# Patient Record
Sex: Male | Born: 1995 | Race: White | Hispanic: No | Marital: Single | Smoking: Never smoker
Health system: Southern US, Community
[De-identification: ages and names within clinical notes are randomized; demographics above are authoritative.]

## PROBLEM LIST (undated history)

## (undated) HISTORY — PX: FOOT SURGERY: SHX648

## (undated) HISTORY — PX: KNEE SURGERY: SHX244

---
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
## 2014-01-01 ENCOUNTER — Ambulatory Visit: Payer: Medicaid Other | Admitting: Podiatry

## 2014-01-19 ENCOUNTER — Encounter: Payer: Self-pay | Admitting: Podiatry

## 2014-01-19 ENCOUNTER — Ambulatory Visit (INDEPENDENT_AMBULATORY_CARE_PROVIDER_SITE_OTHER): Payer: Medicaid Other

## 2014-01-19 ENCOUNTER — Ambulatory Visit (INDEPENDENT_AMBULATORY_CARE_PROVIDER_SITE_OTHER): Payer: Medicaid Other | Admitting: Podiatry

## 2014-01-19 DIAGNOSIS — M21549 Acquired clubfoot, unspecified foot: Secondary | ICD-10-CM

## 2014-01-19 DIAGNOSIS — Z472 Encounter for removal of internal fixation device: Secondary | ICD-10-CM

## 2014-01-19 NOTE — Progress Notes (Signed)
Subjective:     Patient ID: Darrell Elliott, male   DOB: 04-18-1996, 18 y.o.   MRN: [id number]  HPI patient comes in post osteotomy the fifth metatarsal of both feet wearing flip-flops knowing that he was supposed to be wearing surgical shoes and states he feels in the last few days a prominence around his fifth metatarsal shaft and these had minimal discomfort and mild swelling   Review of Systems     Objective:   Physical Exam Neurovascular status is found to be intact with well-healed incision sites fifth metatarsal both feet with a mild increase in edema around the fifth metatarsal right and patient's wearing inappropriate shoe gear and admits he's been doing inappropriate activities and not following advice we have given for the postoperative process    Assessment:     Probable movement of the fifth metatarsal osteotomy right over the left with possible movement of the screw    Plan:     I reviewed with him again that he's been noncompliant and the importance of wearing rigid bottom-type immobilization. I did explain is been movement of the screw fifth metatarsal right and we'll need to be removed but I like to keep it in there now and make sure that osteotomy healing occurs. I do think it will occur but the patient has to be much more careful and he has been up to this point with both feet that he promises me he will and we'll not where shoes such as what is wearing today or go barefoot which she admits he has done against my advice. Reappoint 4 weeks earlier if any issues should occur and we will discuss removal of the screw after x-ray evaluation

## 2014-01-22 ENCOUNTER — Ambulatory Visit (INDEPENDENT_AMBULATORY_CARE_PROVIDER_SITE_OTHER): Payer: Medicaid Other | Admitting: Podiatry

## 2014-01-22 VITALS — BP 137/83 | HR 118 | Resp 16

## 2014-01-22 DIAGNOSIS — Z472 Encounter for removal of internal fixation device: Secondary | ICD-10-CM

## 2014-01-22 DIAGNOSIS — M21549 Acquired clubfoot, unspecified foot: Secondary | ICD-10-CM

## 2014-01-22 MED ORDER — HYDROCODONE-IBUPROFEN 5-200 MG PO TABS: 1.0000 | ORAL_TABLET | Freq: Three times a day (TID) | ORAL | Status: DC | PRN

## 2014-01-22 NOTE — Progress Notes (Signed)
Subjective:     Patient ID: Darrell Elliott, male   DOB: Dec 12, 1995, 18 y.o.   MRN: [id number]  HPI patient presents stating that he is just scared because he still has some pain in his right foot and today after discussion he admits that he was not truthful and he did do pull-ups prior to his last visit and fell on his right foot and that's when he remembers the screw becoming prominent   Review of Systems     Objective:   Physical Exam Neurovascular status intact with continued low level edema in the right forefoot around the fifth metatarsal but no drainage or indications of infection process    Assessment:     Noncompliant patient who has traumatized his right foot causing screw to back up and stress on the osteotomy site    Plan:     With new information I am going to completely take him nonweightbearing on this area and dispensed a wedge shoe to take pressure off of this. I do feel the bone is still healing and I would like to watch it for several more weeks prior to removing the screw. I do feel long-term it will heal in this position but his behavior has lengthed the period It we'll take to heal

## 2014-02-12 ENCOUNTER — Encounter: Payer: Medicaid Other | Admitting: Podiatry

## 2014-02-23 ENCOUNTER — Encounter: Payer: Medicaid Other | Admitting: Podiatry

## 2014-02-26 ENCOUNTER — Encounter: Payer: Self-pay | Admitting: Podiatry

## 2014-02-26 ENCOUNTER — Ambulatory Visit (INDEPENDENT_AMBULATORY_CARE_PROVIDER_SITE_OTHER): Payer: Medicaid Other

## 2014-02-26 ENCOUNTER — Ambulatory Visit (INDEPENDENT_AMBULATORY_CARE_PROVIDER_SITE_OTHER): Payer: Medicaid Other | Admitting: Podiatry

## 2014-02-26 VITALS — BP 130/80 | HR 88 | Resp 16

## 2014-02-26 DIAGNOSIS — M216X9 Other acquired deformities of unspecified foot: Secondary | ICD-10-CM

## 2014-02-26 DIAGNOSIS — Z472 Encounter for removal of internal fixation device: Secondary | ICD-10-CM | POA: Diagnosis not present

## 2014-02-26 NOTE — Progress Notes (Signed)
Subjective:     Patient ID: Darrell Elliott, male   DOB: Sep 17, 1995, 18 y.o.   MRN: [id number]  HPIpatient states that my feet are feeling a lot better but I know I'll need that screw taken out from my right foot   Review of Systems     Objective:   Physical Exam Vascular status intact with well-healing surgical sites fifth metatarsal of both feet with prominence of the screw fifth metatarsal right which is currently not symptomatic right he wants to have removed as it will be very symptomatically gets back and shoe gear    Assessment:     Continue immobilization for 1 more month and we will reappoint at the surgical center to remove screw right. I allowed him to read consent form for removal of screw and he understands risk as outlined and is scheduled for procedure to be performed and will call with questions if she should have any prior to surgery    Plan:     Reviewed screw removal right

## 2014-03-29 ENCOUNTER — Telehealth: Payer: Self-pay | Admitting: *Deleted

## 2014-03-29 NOTE — Telephone Encounter (Signed)
Patient may need to reschedule his surgery.  His mother said she has to get other kids ready for school and on the bus.  You may want to call them and see what they want to do.  Surgery is scheduled for 9:15am but he will have to be here at 8:15am and she said that is too early.  I called and spoke to the patient's mother.  I asked if she wanted to reschedule the surgery.  "No, we'll work something out.  He wants to leave it where it is."  I called and informed Aram BeechamCynthia at the surgery center.  Mother said to leave it scheduled for tomorrow, will work something out.

## 2014-03-30 ENCOUNTER — Encounter: Payer: Medicaid Other | Admitting: Podiatry

## 2014-03-30 ENCOUNTER — Encounter: Payer: Self-pay | Admitting: Podiatry

## 2014-03-30 DIAGNOSIS — M2040 Other hammer toe(s) (acquired), unspecified foot: Secondary | ICD-10-CM

## 2014-03-31 ENCOUNTER — Telehealth: Payer: Self-pay

## 2014-03-31 NOTE — Telephone Encounter (Signed)
Left message for patient to call with questions or concerns. 

## 2014-04-02 NOTE — Progress Notes (Signed)
Removal screw rt foot   Dos 12.8.15

## 2014-04-06 ENCOUNTER — Ambulatory Visit (INDEPENDENT_AMBULATORY_CARE_PROVIDER_SITE_OTHER): Payer: Medicaid Other

## 2014-04-06 ENCOUNTER — Encounter: Payer: Self-pay | Admitting: Podiatry

## 2014-04-06 ENCOUNTER — Ambulatory Visit (INDEPENDENT_AMBULATORY_CARE_PROVIDER_SITE_OTHER): Payer: Medicaid Other | Admitting: Podiatry

## 2014-04-06 VITALS — BP 118/60 | HR 77 | Resp 16

## 2014-04-06 DIAGNOSIS — Z472 Encounter for removal of internal fixation device: Secondary | ICD-10-CM

## 2014-04-06 DIAGNOSIS — M2040 Other hammer toe(s) (acquired), unspecified foot: Secondary | ICD-10-CM

## 2014-04-06 NOTE — Progress Notes (Signed)
Subjective:     Patient ID: Darrell Elliott, male   DOB: [birth year], 18 y.o.   MRN: [id number]  HPI patient states that my foot is doing well with minimal discomfort   Review of Systems     Objective:   Physical Exam Neurovascular status intact with stitches in place right fifth metatarsal secondary to screw removal    Assessment:     Doing well post screw removal right    Plan:     Reviewed x-ray and reapplied sterile dressing and reappoint 2 weeks for suture removal earlier if any conditions should occur

## 2014-04-20 ENCOUNTER — Ambulatory Visit (INDEPENDENT_AMBULATORY_CARE_PROVIDER_SITE_OTHER): Payer: Medicaid Other | Admitting: Podiatry

## 2014-04-20 VITALS — BP 120/63 | HR 71 | Resp 16

## 2014-04-20 DIAGNOSIS — Z472 Encounter for removal of internal fixation device: Secondary | ICD-10-CM

## 2014-04-21 NOTE — Progress Notes (Signed)
Subjective:     Patient ID: Darrell Elliott, male   DOB: [birth year], 18 y.o.   MRN: [id number]  HPI patient states she's doing fine with his right foot and the stitches are well coapted and he's not having pain   Review of Systems     Objective:   Physical Exam Neurovascular status intact wound edges well coapted fifth metatarsal right with no discomfort on either one and minimal swelling noted    Assessment:     Doing well post fifth metatarsal osteotomy both feet with no clinical problems    Plan:     Stitches were removed right and advised on gradual return to activity over the next 4 weeks. Reappoint if any problems should occur

## 2015-07-30 ENCOUNTER — Encounter: Payer: Self-pay | Admitting: Emergency Medicine

## 2015-07-30 ENCOUNTER — Emergency Department
Admission: EM | Admit: 2015-07-30 | Discharge: 2015-07-30 | Disposition: A | Discharge status: Home / Self Care | Payer: PRIVATE HEALTH INSURANCE | Attending: Emergency Medicine | Admitting: Emergency Medicine

## 2015-07-30 DIAGNOSIS — K529 Noninfective gastroenteritis and colitis, unspecified: Secondary | ICD-10-CM

## 2015-07-30 DIAGNOSIS — E860 Dehydration: Secondary | ICD-10-CM

## 2015-07-30 DIAGNOSIS — R1110 Vomiting, unspecified: Secondary | ICD-10-CM | POA: Diagnosis present

## 2015-07-30 LAB — URINALYSIS COMPLETE WITH MICROSCOPIC (ARMC ONLY)
BILIRUBIN URINE: NEGATIVE
Bacteria, UA: NONE SEEN
GLUCOSE, UA: NEGATIVE mg/dL
Hgb urine dipstick: NEGATIVE
Leukocytes, UA: NEGATIVE
Nitrite: NEGATIVE
PH: 5 (ref 5.0–8.0)
Protein, ur: NEGATIVE mg/dL
SQUAMOUS EPITHELIAL / LPF: NONE SEEN
Specific Gravity, Urine: 1.027 (ref 1.005–1.030)

## 2015-07-30 LAB — COMPREHENSIVE METABOLIC PANEL
ALBUMIN: 5.1 g/dL — AB (ref 3.5–5.0)
ALK PHOS: 81 U/L (ref 38–126)
ALT: 23 U/L (ref 17–63)
ANION GAP: 7 (ref 5–15)
AST: 25 U/L (ref 15–41)
BILIRUBIN TOTAL: 1.8 mg/dL — AB (ref 0.3–1.2)
BUN: 17 mg/dL (ref 6–20)
CALCIUM: 9.3 mg/dL (ref 8.9–10.3)
CO2: 24 mmol/L (ref 22–32)
CREATININE: 1.18 mg/dL (ref 0.61–1.24)
Chloride: 106 mmol/L (ref 101–111)
GFR calc non Af Amer: >60 mL/min (ref >60)
GLUCOSE: 134 mg/dL — AB (ref 65–99)
Potassium: 4.4 mmol/L (ref 3.5–5.1)
SODIUM: 137 mmol/L (ref 135–145)
TOTAL PROTEIN: 7.5 g/dL (ref 6.5–8.1)

## 2015-07-30 LAB — CBC
HCT: 48.5 % (ref 40.0–52.0)
HEMOGLOBIN: 16.4 g/dL (ref 13.0–18.0)
MCH: 31.6 pg (ref 26.0–34.0)
MCHC: 33.9 g/dL (ref 32.0–36.0)
MCV: 93.2 fL (ref 80.0–100.0)
PLATELETS: 161 10*3/uL (ref 150–440)
RBC: 5.2 MIL/uL (ref 4.40–5.90)
RDW: 12.6 % (ref 11.5–14.5)
WBC: 15.8 10*3/uL — ABNORMAL HIGH (ref 3.8–10.6)

## 2015-07-30 LAB — LIPASE, BLOOD: Lipase: 13 U/L (ref 11–51)

## 2015-07-30 MED ORDER — ONDANSETRON 4 MG PO TBDP
ORAL_TABLET | ORAL | Status: AC
  Administered 2015-07-30: 4 mg via ORAL
  Filled 2015-07-30: qty 1

## 2015-07-30 MED ORDER — ONDANSETRON 4 MG PO TBDP
4.0000 mg | ORAL_TABLET | Freq: Once | ORAL | Status: AC
  Administered 2015-07-30: 4 mg via ORAL

## 2015-07-30 MED ORDER — ONDANSETRON 4 MG PO TBDP: 4.0000 mg | ORAL_TABLET | Freq: Three times a day (TID) | ORAL | Status: DC | PRN

## 2015-07-30 MED ORDER — SODIUM CHLORIDE 0.9 % IV BOLUS (SEPSIS)
1000.0000 mL | Freq: Once | INTRAVENOUS | Status: AC
  Administered 2015-07-30: 1000 mL via INTRAVENOUS

## 2015-07-30 NOTE — ED Notes (Signed)
Pt to ed with c/o vomiting and diarrhea since last night after eating food from [employer].  Pt states he was able to keep gatorade down this am.

## 2015-07-30 NOTE — ED Provider Notes (Signed)
[facility] Emergency Department Provider Note  ____________________________________________  Time seen: Approximately 4:07 PM  I have reviewed the triage vital signs and the nursing notes.   HISTORY  Chief Complaint Emesis and Diarrhea    HPI Darrell Elliott is a 20 y.o. male , NAD, presents to the emergency department with a 16 hour history of nausea, vomiting, diarrhea. States the symptoms began approximately 4-6 hours after [facility] [employer]. Has had intermittent abdominal cramping that was also diarrhea as well as vomiting. Has not noted any blood in his emesis nor feces. Has not been able to keep fluids down by mouth since yesterday evening. Notes she's had greater than 20 episodes of emesis. Has had one episode of emesis and diarrhea since being in the emergency department lobby. Denies any other sick contacts. Denies fever, chills, body aches. Has felt sweaty at times. Has had no headache, LOC, dizziness, palpitations.   History reviewed. No pertinent past medical history.  There are no active problems to display for this patient.   History reviewed. No pertinent past surgical history.  Current Outpatient Rx  Name  Route  Sig  Dispense  Refill  . hydrocodone-ibuprofen (VICOPROFEN) 5-200 MG per tablet   Oral   Take 1 tablet by mouth every 8 (eight) hours as needed for pain.   30 tablet   0   . ondansetron (ZOFRAN ODT) 4 MG disintegrating tablet   Oral   Take 1 tablet (4 mg total) by mouth every 8 (eight) hours as needed for nausea or vomiting.   12 tablet   0   . oxyCODONE-acetaminophen (PERCOCET) 10-325 MG per tablet   Oral   Take 1 tablet by mouth every 4 (four) hours as needed for pain.           Allergies Review of patient's allergies indicates no known allergies.  History reviewed. No pertinent family history.  Social History Social History  Substance Use Topics  . Smoking status: Never Smoker   . Smokeless tobacco: None   . Alcohol Use: No     Review of Systems  Constitutional: Positive chills, sweats. No fevers.  Eyes: No visual changes.  Cardiovascular: No chest pain or palpitations. Respiratory: No cough. No shortness of breath. No wheezing.  Gastrointestinal: Positive intermittent abdominal cramping, nausea, vomiting, diarrhea. No constipation. Genitourinary: Negative for dysuria, hematuria. No urinary hesitancy, urgency or increased frequency. Musculoskeletal: Negative for back pain.  Skin: Negative for rash him a skin sores. Neurological: Negative for headaches, focal weakness or numbness. 10-point ROS otherwise negative.  ____________________________________________   PHYSICAL EXAM:  VITAL SIGNS: ED Triage Vitals  Enc Vitals Group     BP 07/30/15 1331 105/76 mmHg     Pulse Rate 07/30/15 1331 98     Resp 07/30/15 1331 20     Temp 07/30/15 1331 98.2 F (36.8 C)     Temp Source 07/30/15 1331 Oral     SpO2 07/30/15 1331 97 %     Weight 07/30/15 1331 180 lb (81.647 kg)     Height 07/30/15 1331 6' (1.829 m)     Head Cir --      Peak Flow --      Pain Score 07/30/15 1332 7     Pain Loc --      Pain Edu? --      Excl. in GC? --     Constitutional: Alert and oriented. Ill appearing with dark circles around the eyes but in no  acute distress. Eyes: Conjunctivae are normal. PERRL. EOMI without pain.  Head: Atraumatic. ENT:      Mouth/Throat: Mucous membranes are moist.  Neck: Supple with full range of motion Hematological/Lymphatic/Immunilogical: No cervical lymphadenopathy. Cardiovascular: Normal rate, regular rhythm. Normal S1 and S2.  Good peripheral circulation. Respiratory: Normal respiratory effort without tachypnea or retractions. Lungs CTAB with breath sounds noted in all lung fields. Gastrointestinal: Soft and nontender in all quadrants. No distention or guarding. Bowel sounds grossly normal in all quadrants. Musculoskeletal: No lower extremity tenderness nor edema.  No joint  effusions. Neurologic:  Normal speech and language. No gross focal neurologic deficits are appreciated.  Skin:  Skin is warm, dry and intact. No rash noted. Psychiatric: Mood and affect are normal. Speech and behavior are normal. Patient exhibits appropriate insight and judgement.   ____________________________________________   LABS (all labs ordered are listed, but only abnormal results are displayed)  Labs Reviewed  COMPREHENSIVE METABOLIC PANEL - Abnormal; Notable for the following:    Glucose, Bld 134 (*)    Albumin 5.1 (*)    Total Bilirubin 1.8 (*)    All other components within normal limits  CBC - Abnormal; Notable for the following:    WBC 15.8 (*)    All other components within normal limits  URINALYSIS COMPLETEWITH MICROSCOPIC ([facility] ONLY) - Abnormal; Notable for the following:    Color, Urine YELLOW (*)    APPearance CLEAR (*)    Ketones, ur TRACE (*)    All other components within normal limits  LIPASE, BLOOD   ____________________________________________  EKG  None ____________________________________________  RADIOLOGY  None ____________________________________________    PROCEDURES  Procedure(s) performed: None      Medications  sodium chloride 0.9 % bolus 1,000 mL (0 mLs Intravenous Stopped 07/30/15 1638)  ondansetron (ZOFRAN-ODT) disintegrating tablet 4 mg (4 mg Oral Given 07/30/15 1537)   ----------------------------------------- 4:19 PM on 07/30/2015 -----------------------------------------  Patient reports feeling significantly better as he has received 500 mL of IV fluids and Zofran. Has had no further nausea, vomiting, diarrhea since being in the flexed department. Once the thousand milliliters of fluids or complete, patient should be ready for discharge.  ____________________________________________   INITIAL IMPRESSION / ASSESSMENT AND PLAN / ED COURSE  Pertinent lab results that were available during my care of the patient were  reviewed by me and considered in my medical decision making (see chart for details).  Patient's diagnosis is consistent with acute gastroenteritis causing dehydration. Patient will be discharged home with prescriptions for Zofran ODT to take as directed. Patient is to follow up with his primary care provider or [facility] if symptoms persist past this treatment course. Patient is given ED precautions to return to the ED for any worsening or new symptoms.    ____________________________________________  FINAL CLINICAL IMPRESSION(S) / ED DIAGNOSES  Final diagnoses:  Gastroenteritis, acute  Dehydration      NEW MEDICATIONS STARTED DURING THIS VISIT:  New Prescriptions   ONDANSETRON (ZOFRAN ODT) 4 MG DISINTEGRATING TABLET    Take 1 tablet (4 mg total) by mouth every 8 (eight) hours as needed for nausea or vomiting.         Hope Pigeon, PA-C 07/30/15 1640  Myrna Blazer, MD 07/31/15 [phone]

## 2015-07-30 NOTE — Discharge Instructions (Signed)
Dehydration, Adult °Dehydration is a condition in which you do not have enough fluid or water in your body. It happens when you take in less fluid than you lose. Vital organs such as the kidneys, brain, and heart cannot function without a proper amount of fluids. Any loss of fluids from the body can cause dehydration.  °Dehydration can range from mild to severe. This condition should be treated right away to help prevent it from becoming severe. °CAUSES  °This condition may be caused by: °· Vomiting. °· Diarrhea. °· Excessive sweating, such as when exercising in hot or humid weather. °· Not drinking enough fluid during strenuous exercise or during an illness. °· Excessive urine output. °· Fever. °· Certain medicines. °RISK FACTORS °This condition is more likely to develop in: °· People who are taking certain medicines that cause the body to lose excess fluid (diuretics).   °· People who have a chronic illness, such as diabetes, that may increase urination. °· Older adults.   °· People who live at high altitudes.   °· People who participate in endurance sports.   °SYMPTOMS  °Mild Dehydration °· Thirst. °· Dry lips. °· Slightly dry mouth. °· Dry, warm skin. °Moderate Dehydration °· Very dry mouth.   °· Muscle cramps.   °· Dark urine and decreased urine production.   °· Decreased tear production.   °· Headache.   °· Light-headedness, especially when you stand up from a sitting position.   °Severe Dehydration °· Changes in skin.   °¨ Cold and clammy skin.   °¨ Skin does not spring back quickly when lightly pinched and released.   °· Changes in body fluids.   °¨ Extreme thirst.   °¨ No tears.   °¨ Not able to sweat when body temperature is high, such as in hot weather.   °¨ Minimal urine production.   °· Changes in vital signs.   °¨ Rapid, weak pulse (more than 100 beats per minute when you are sitting still).   °¨ Rapid breathing.   °¨ Low blood pressure.   °· Other changes.   °¨ Sunken eyes.   °¨ Cold hands and feet.    °¨ Confusion. °¨ Lethargy and difficulty being awakened. °¨ Fainting (syncope).   °¨ Short-term weight loss.   °¨ Unconsciousness. °DIAGNOSIS  °This condition may be diagnosed based on your symptoms. You may also have tests to determine how severe your dehydration is. These tests may include:  °· Urine tests.   °· Blood tests.   °TREATMENT  °Treatment for this condition depends on the severity. Mild or moderate dehydration can often be treated at home. Treatment should be started right away. Do not wait until dehydration becomes severe. Severe dehydration needs to be treated at the hospital. °Treatment for Mild Dehydration °· Drinking plenty of water to replace the fluid you have lost.   °· Replacing minerals in your blood (electrolytes) that you may have lost.   °Treatment for Moderate Dehydration  °· Consuming oral rehydration solution (ORS). °Treatment for Severe Dehydration °· Receiving fluid through an IV tube.   °· Receiving electrolyte solution through a feeding tube that is passed through your nose and into your stomach (nasogastric tube or NG tube). °· Correcting any abnormalities in electrolytes. °HOME CARE INSTRUCTIONS  °· Drink enough fluid to keep your urine clear or pale yellow.   °· Drink water or fluid slowly by taking small sips. You can also try sucking on ice cubes.  °· Have food or beverages that contain electrolytes. Examples include bananas and sports drinks. °· Take over-the-counter and prescription medicines only as told by your health care provider.   °· Prepare ORS according to the manufacturer's instructions. Take sips   of ORS every 5 minutes until your urine returns to normal.  If you have vomiting or diarrhea, continue to try to drink water, ORS, or both.   If you have diarrhea, avoid:   Beverages that contain caffeine.   Fruit juice.   Milk.   Carbonated soft drinks.  Do not take salt tablets. This can lead to the condition of having too much sodium in your body  (hypernatremia).  SEEK MEDICAL CARE IF:  You cannot eat or drink without vomiting.  You have had moderate diarrhea during a period of more than 24 hours.  You have a fever. SEEK IMMEDIATE MEDICAL CARE IF:   You have extreme thirst.  You have severe diarrhea.  You have not urinated in 6-8 hours, or you have urinated only a small amount of very dark urine.  You have shriveled skin.  You are dizzy, confused, or both.   This information is not intended to replace advice given to you by your health care provider. Make sure you discuss any questions you have with your health care provider.   Document Released: 04/09/2005 Document Revised: 12/29/2014 Document Reviewed: 08/25/2014 Elsevier Interactive Patient Education 2016 [employer].  Food Choices to Help Relieve Diarrhea, Adult When you have diarrhea, the foods you eat and your eating habits are very important. Choosing the right foods and drinks can help relieve diarrhea. Also, because diarrhea can last up to 7 days, you need to replace lost fluids and electrolytes (such as sodium, potassium, and chloride) in order to help prevent dehydration.  WHAT GENERAL GUIDELINES DO I NEED TO FOLLOW?  Slowly drink 1 cup (8 oz) of fluid for each episode of diarrhea. If you are getting enough fluid, your urine will be clear or pale yellow.  Eat starchy foods. Some good choices include white rice, white toast, pasta, low-fiber cereal, baked potatoes (without the skin), saltine crackers, and bagels.  Avoid large servings of any cooked vegetables.  Limit fruit to two servings per day. A serving is  cup or 1 small piece.  Choose foods with less than 2 g of fiber per serving.  Limit fats to less than 8 tsp (38 g) per day.  Avoid fried foods.  Eat foods that have probiotics in them. Probiotics can be found in certain dairy products.  Avoid foods and beverages that may increase the speed at which food moves through the stomach and intestines  (gastrointestinal tract). Things to avoid include:  High-fiber foods, such as dried fruit, raw fruits and vegetables, nuts, seeds, and whole grain foods.  Spicy foods and high-fat foods.  Foods and beverages sweetened with high-fructose corn syrup, honey, or sugar alcohols such as xylitol, sorbitol, and mannitol. WHAT FOODS ARE RECOMMENDED? Grains White rice. White, [place], or pita breads (fresh or toasted), including plain rolls, buns, or bagels. White pasta. Saltine, soda, or graham crackers. Pretzels. Low-fiber cereal. Cooked cereals made with water (such as cornmeal, farina, or cream cereals). Plain muffins. Matzo. Melba toast. Zwieback.  Vegetables Potatoes (without the skin). Strained tomato and vegetable juices. Most well-cooked and canned vegetables without seeds. Tender lettuce. Fruits Cooked or canned applesauce, apricots, cherries, fruit cocktail, grapefruit, peaches, pears, or plums. Fresh bananas, apples without skin, cherries, grapes, cantaloupe, grapefruit, peaches, oranges, or plums.  Meat and Other Protein Products Baked or boiled chicken. Eggs. Tofu. Fish. Seafood. Smooth peanut butter. Ground or well-cooked tender beef, ham, veal, lamb, pork, or poultry.  Dairy Plain yogurt, kefir, and unsweetened liquid yogurt. Lactose-free milk, buttermilk, or  soy milk. Plain hard cheese. Beverages Sport drinks. Clear broths. Diluted fruit juices (except prune). Regular, caffeine-free sodas such as ginger ale. Water. Decaffeinated teas. Oral rehydration solutions. Sugar-free beverages not sweetened with sugar alcohols. Other Bouillon, broth, or soups made from recommended foods.  The items listed above may not be a complete list of recommended foods or beverages. Contact your dietitian for more options. WHAT FOODS ARE NOT RECOMMENDED? Grains Whole grain, whole wheat, bran, or rye breads, rolls, pastas, crackers, and cereals. Wild or brown rice. Cereals that contain more than 2 g of fiber  per serving. Corn tortillas or taco shells. Cooked or dry oatmeal. Granola. Popcorn. Vegetables Raw vegetables. Cabbage, broccoli, Brussels sprouts, artichokes, baked beans, beet greens, corn, kale, legumes, peas, sweet potatoes, and yams. Potato skins. Cooked spinach and cabbage. Fruits Dried fruit, including raisins and dates. Raw fruits. Stewed or dried prunes. Fresh apples with skin, apricots, mangoes, pears, raspberries, and strawberries.  Meat and Other Protein Products Chunky peanut butter. Nuts and seeds. Beans and lentils. Tomasa Blase.  Dairy High-fat cheeses. Milk, chocolate milk, and beverages made with milk, such as milk shakes. Cream. Ice cream. Sweets and Desserts Sweet rolls, doughnuts, and sweet breads. Pancakes and waffles. Fats and Oils Butter. Cream sauces. Margarine. Salad oils. Plain salad dressings. Olives. Avocados.  Beverages Caffeinated beverages (such as coffee, tea, soda, or energy drinks). Alcoholic beverages. Fruit juices with pulp. Prune juice. Soft drinks sweetened with high-fructose corn syrup or sugar alcohols. Other Coconut. Hot sauce. Chili powder. Mayonnaise. Gravy. Cream-based or milk-based soups.  The items listed above may not be a complete list of foods and beverages to avoid. Contact your dietitian for more information. WHAT SHOULD I DO IF I BECOME DEHYDRATED? Diarrhea can sometimes lead to dehydration. Signs of dehydration include dark urine and dry mouth and skin. If you think you are dehydrated, you should rehydrate with an oral rehydration solution. These solutions can be purchased at pharmacies, retail stores, or online.  Drink -1 cup (120-240 mL) of oral rehydration solution each time you have an episode of diarrhea. If drinking this amount makes your diarrhea worse, try drinking smaller amounts more often. For example, drink 1-3 tsp (5-15 mL) every 5-10 minutes.  A general rule for staying hydrated is to drink 1-2 L of fluid per day. Talk to your  health care provider about the specific amount you should be drinking each day. Drink enough fluids to keep your urine clear or pale yellow.   This information is not intended to replace advice given to you by your health care provider. Make sure you discuss any questions you have with your health care provider.   Document Released: 06/30/2003 Document Revised: 04/30/2014 Document Reviewed: 03/02/2013 Elsevier Interactive Patient Education 2016 Elsevier Inc.  Rehydration, Adult Rehydration is the replacement of body fluids lost during dehydration. Dehydration is an extreme loss of body fluids to the point of body function impairment. There are many ways extreme fluid loss can occur, including vomiting, diarrhea, or excess sweating. Recovering from dehydration requires replacing lost fluids, continuing to eat to maintain strength, and avoiding foods and beverages that may contribute to further fluid loss or may increase nausea. HOW TO REHYDRATE In most cases, rehydration involves the replacement of not only fluids but also carbohydrates and basic body salts. Rehydration with an oral rehydration solution is one way to replace essential nutrients lost through dehydration. An oral rehydration solution can be purchased at pharmacies, retail stores, and online. Premixed packets of powder that you  combine with water to make a solution are also sold. You can prepare an oral rehydration solution at home by mixing the following ingredients together:    - tsp table salt.   tsp baking soda.   tsp salt substitute containing potassium chloride.  1 tablespoons sugar.  1 L (34 oz) of water. Be sure to use exact measurements. Including too much sugar can make diarrhea worse. Drink -1 cup (120-240 mL) of oral rehydration solution each time you have diarrhea or vomit. If drinking this amount makes your vomiting worse, try drinking smaller amounts more often. For example, drink 1-3 tsp every 5-10 minutes.  A  general rule for staying hydrated is to drink 1-2 L of fluid per day. Talk to your caregiver about the specific amount you should be drinking each day. Drink enough fluids to keep your urine clear or pale yellow. EATING WHEN DEHYDRATED Even if you have had severe sweating or you are having diarrhea, do not stop eating. Many healthy items in a normal diet are okay to continue eating while recovering from dehydration. The following tips can help you to lessen nausea when you eat:  Ask someone else to prepare your food. Cooking smells may worsen nausea.  Eat in a well-ventilated room away from cooking smells.  Sit up when you eat. Avoid lying down until 1-2 hours after eating.  Eat small amounts when you eat.  Eat foods that are easy to digest. These include soft, well-cooked, or mashed foods. FOODS AND BEVERAGES TO AVOID Avoid eating or drinking the following foods and beverages that may increase nausea or further loss of fluid:   Fruit juices with a high sugar content, such as concentrated juices.  Alcohol.  Beverages containing caffeine.  Carbonated drinks. They may cause a lot of gas.  Foods that may cause a lot of gas, such as cabbage, broccoli, and beans.  Fatty, greasy, and fried foods.  Spicy, very salty, and very sweet foods or drinks.  Foods or drinks that are very hot or very cold. Consume food or drinks at or near room temperature.  Foods that need a lot of chewing, such as raw vegetables.  Foods that are sticky or hard to swallow, such as peanut butter.   This information is not intended to replace advice given to you by your health care provider. Make sure you discuss any questions you have with your health care provider.   Document Released: 07/02/2011 Document Revised: 01/02/2012 Document Reviewed: 07/02/2011 Elsevier Interactive Patient Education [employer]2016 Elsevier Inc.

## 2015-07-30 NOTE — ED Notes (Signed)
Patient states that he has been vomiting all night, reports that he has vomited about 20 times. [facility] patient has also been having abd cramps and diarrhea. Patient states that he is unable to keep any fluids down.

## 2016-01-26 ENCOUNTER — Ambulatory Visit: Payer: Self-pay | Admitting: Family Medicine

## 2016-08-17 ENCOUNTER — Ambulatory Visit (INDEPENDENT_AMBULATORY_CARE_PROVIDER_SITE_OTHER): Payer: BLUE CROSS/BLUE SHIELD | Admitting: Physician Assistant

## 2016-08-17 ENCOUNTER — Ambulatory Visit (INDEPENDENT_AMBULATORY_CARE_PROVIDER_SITE_OTHER): Payer: BLUE CROSS/BLUE SHIELD

## 2016-08-17 ENCOUNTER — Encounter: Payer: Self-pay | Admitting: Physician Assistant

## 2016-08-17 VITALS — BP 128/68 | HR 72 | Temp 98.5°F | Resp 17 | Ht 70.0 in | Wt 177.0 lb

## 2016-08-17 DIAGNOSIS — M79641 Pain in right hand: Secondary | ICD-10-CM

## 2016-08-17 NOTE — Progress Notes (Signed)
  08/17/2016 5:33 PM   DOB: 03/04/1996 / MRN: [id number]  SUBJECTIVE:  Darrell Elliott is a 21 y.o. male presenting for right hand pain that started after a slamming a closed fist on a counter top.  This occurred two weeks ago.  He associates bruising and swelling. The hand functions normally for him.  He denies numbness.   He has No Known Allergies.   He  has no past medical history on file.    He  reports that he has never smoked. He has never used smokeless tobacco. He reports that he does not drink alcohol or use drugs. He  has no sexual activity history on file. The patient  has a past surgical history that includes Knee surgery (Left) and Foot surgery (Bilateral).  His family history is not on file.  Review of Systems  Musculoskeletal: Positive for joint pain. Negative for back pain, myalgias and neck pain.  Skin: Negative for itching.    The problem list and medications were reviewed and updated by myself where necessary and exist elsewhere in the encounter.   OBJECTIVE:  BP 128/68 (BP Location: Right Arm, Patient Position: Sitting, Cuff Size: Normal)   Pulse 72   Temp 98.5 F (36.9 C) (Oral)   Resp 17   Ht  (1.778 m)   Wt 177 lb (80.3 kg)   SpO2 96%   BMI 25.40 kg/m   Physical Exam  Constitutional: He appears well-developed. He is active and cooperative.  Non-toxic appearance.  Cardiovascular: Normal rate.   Pulmonary/Chest: Effort normal. No tachypnea.  Musculoskeletal:       Right hand: He exhibits normal range of motion, no bony tenderness, no deformity and no swelling. Normal sensation noted. Normal strength noted. He exhibits no finger abduction, no thumb/finger opposition and no wrist extension trouble.       Hands: Neurological: He is alert.  Skin: Skin is warm and dry. He is not diaphoretic. No pallor.  Vitals reviewed.   No results found for this or any previous visit (from the past 72 hour(s)).  Dg Hand 2 View Right  Result Date:  08/17/2016 CLINICAL DATA:  Right hand pain, injury 2 weeks ago EXAM: RIGHT HAND - 2 VIEW COMPARISON:  None. FINDINGS: No fracture or dislocation is seen. The joint spaces are preserved. The visualized soft tissues are unremarkable. IMPRESSION: Negative. Electronically Signed   By: Charline Bills M.D.   On: 08/17/2016 17:20    ASSESSMENT AND PLAN:  Darrell Elliott was seen today for hand injury.  Diagnoses and all orders for this visit:  Right hand pain: No fracture.  Patient reassured and advised to let pain be his guide.  -     DG Hand 2 View Right; Future    The patient is advised to call or return to clinic if he does not see an improvement in symptoms, or to seek the care of the closest emergency department if he worsens with the above plan.   Deliah Boston, MHS, PA-C [facility] 08/17/2016 5:33 PM

## 2016-08-17 NOTE — Patient Instructions (Signed)
     IF you received an x-ray today, you will receive an invoice from [facility]. Please contact [facility] at [phone] with questions or concerns regarding your invoice.   IF you received labwork today, you will receive an invoice from [facility]. Please contact [facility] at [phone] with questions or concerns regarding your invoice.   Our billing staff will not be able to assist you with questions regarding bills from these companies.  You will be contacted with the lab results as soon as they are available. The fastest way to get your results is to activate your My Chart account. Instructions are located on the last page of this paperwork. If you have not heard from us regarding the results in 2 weeks, please contact this office.     

## 2017-04-24 ENCOUNTER — Ambulatory Visit: Payer: PRIVATE HEALTH INSURANCE | Admitting: Primary Care

## 2018-08-12 IMAGING — DX DG HAND 2V*R*
2 series · 2 of 2 positions shown · non-contrast
Comparison: None.

CLINICAL DATA: Right hand pain, injury 2 weeks ago

EXAM:
RIGHT HAND - 2 VIEW

[hand pa]
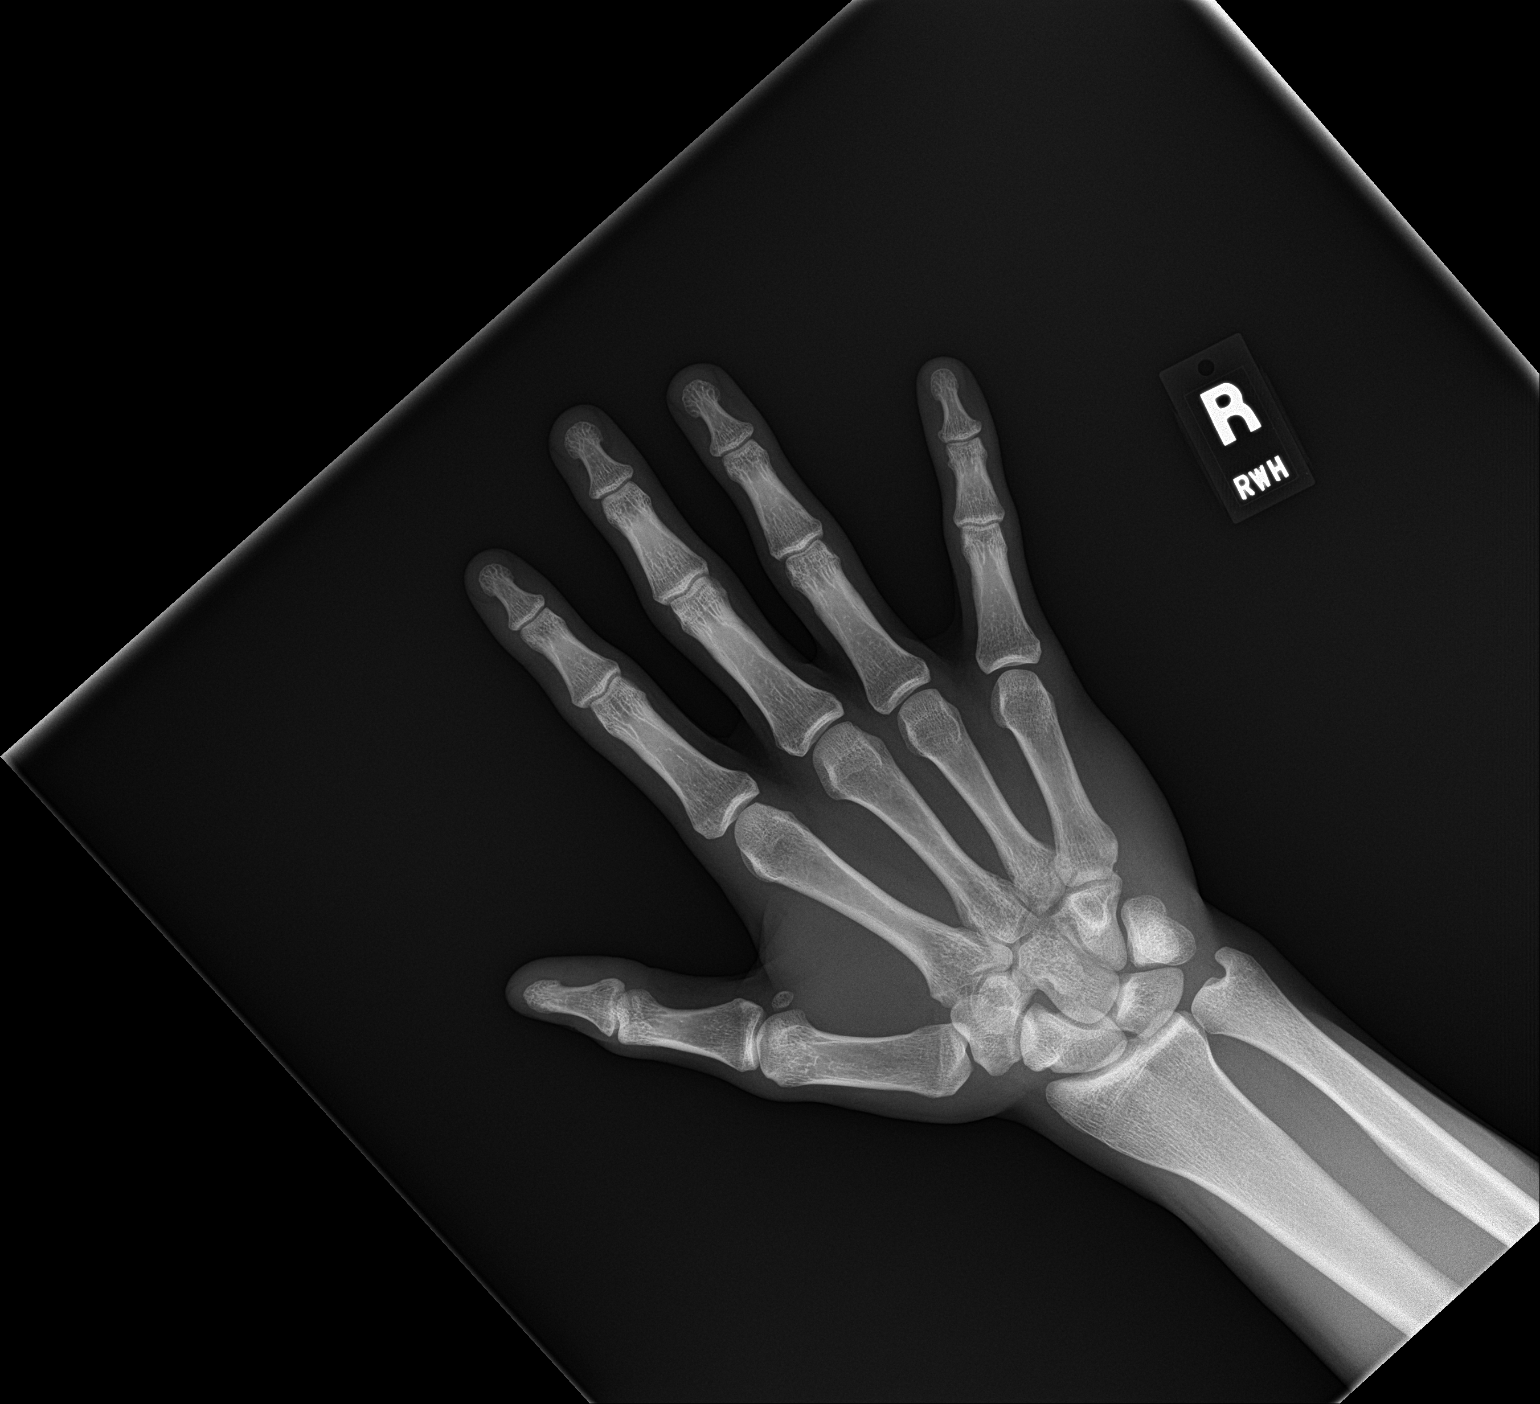

[hand lat]
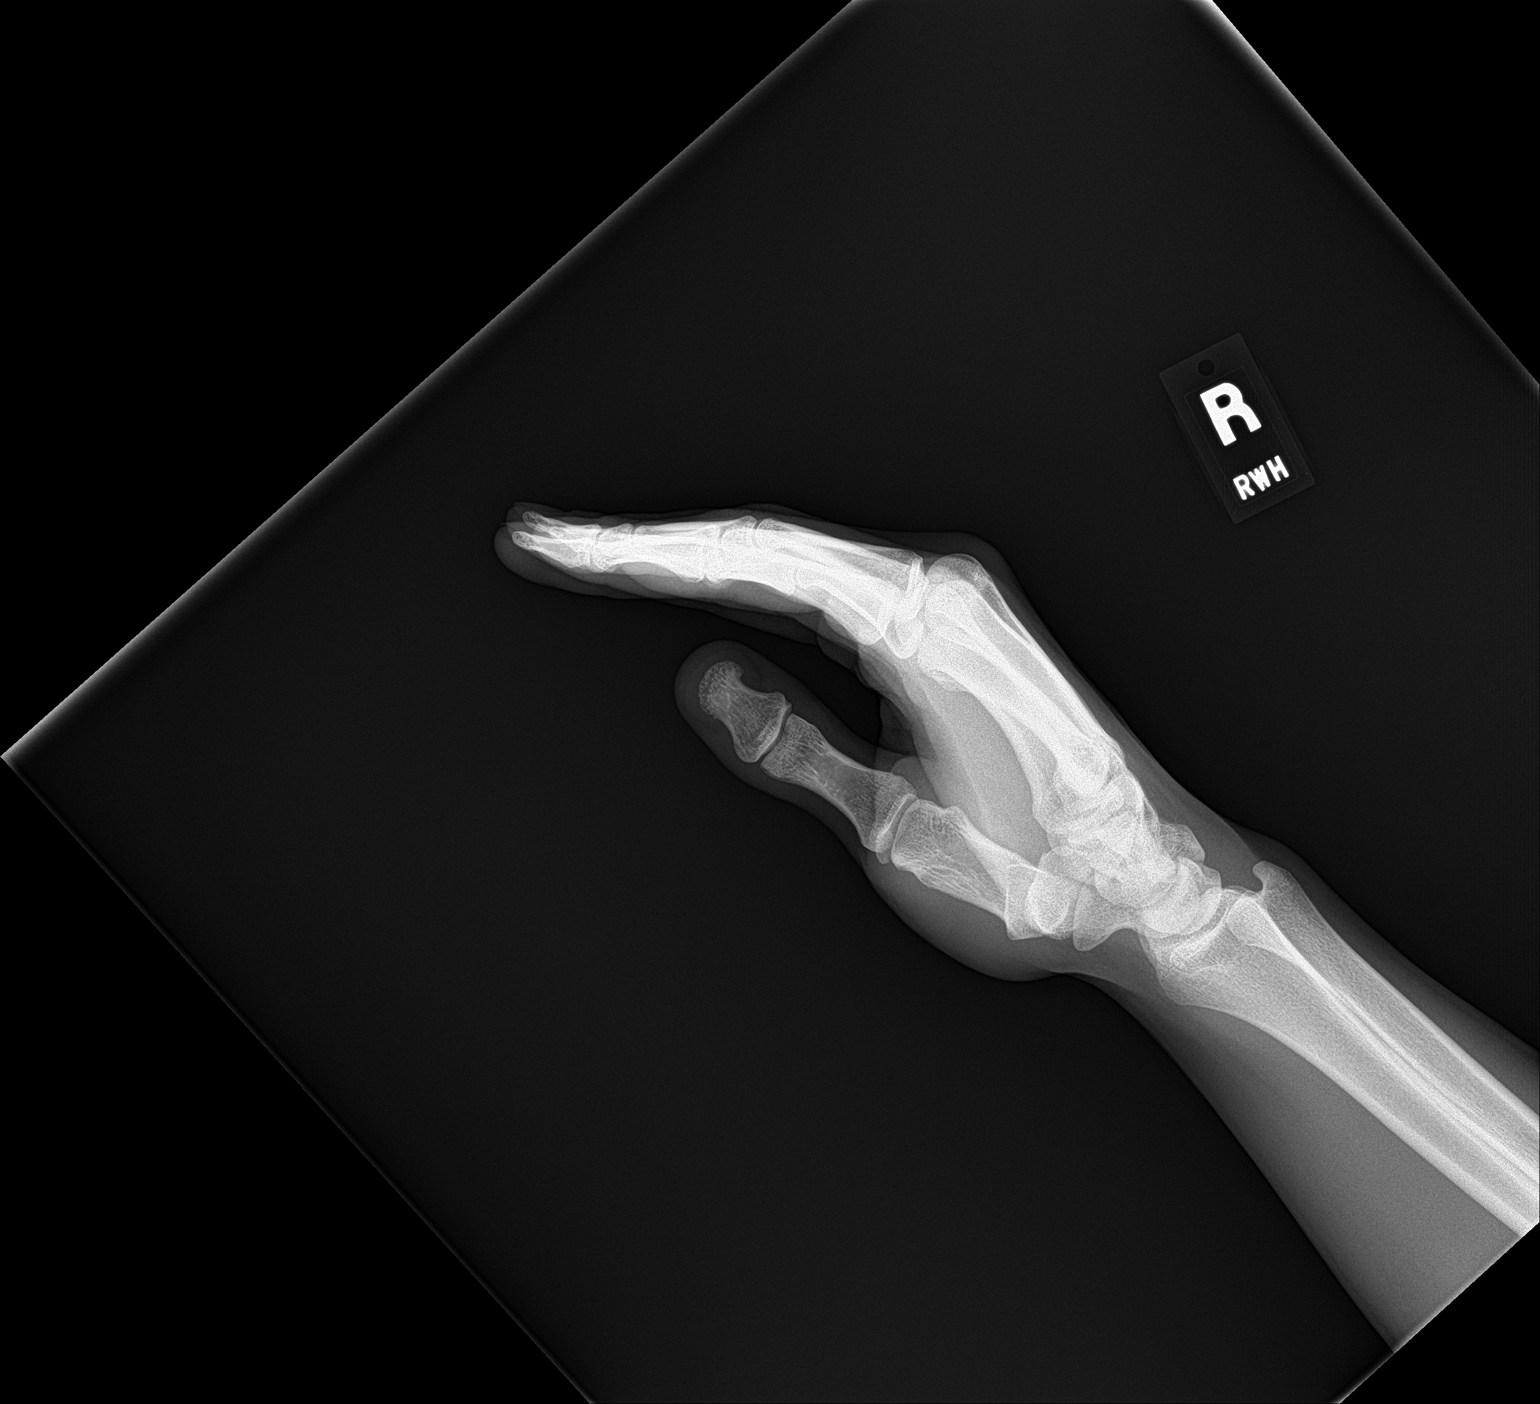

[2 of 2 positions shown; findings below may reference images not displayed]

FINDINGS: No fracture or dislocation is seen.

The joint spaces are preserved.

The visualized soft tissues are unremarkable.
IMPRESSION: Negative.

## 2019-02-09 ENCOUNTER — Ambulatory Visit: Payer: Self-pay | Admitting: Physician Assistant

## 2019-02-09 ENCOUNTER — Other Ambulatory Visit: Payer: Self-pay

## 2019-02-09 DIAGNOSIS — Z113 Encounter for screening for infections with a predominantly sexual mode of transmission: Secondary | ICD-10-CM

## 2019-02-09 DIAGNOSIS — N341 Nonspecific urethritis: Secondary | ICD-10-CM

## 2019-02-09 LAB — GRAM STAIN

## 2019-02-09 MED ORDER — AZITHROMYCIN 500 MG PO TABS
1000.0000 mg | ORAL_TABLET | Freq: Once | ORAL | Status: AC
  Administered 2019-02-09: 1000 mg via ORAL

## 2019-02-09 NOTE — Progress Notes (Signed)
Gram stain reviewed and pt treated for NGU per standing order and per Antoine Primas, PA verbal order. Provider orders completed.Ronny Bacon, RN

## 2019-02-10 ENCOUNTER — Encounter: Payer: Self-pay | Admitting: Physician Assistant

## 2019-02-10 NOTE — Progress Notes (Signed)
    STI clinic/screening visit  Subjective:  Darrell Elliott is a 23 y.o. male being seen today for an STI screening visit. The patient reports they do not have symptoms.  Patient has the following medical conditions:  There are no active problems to display for this patient.    Chief Complaint  Patient presents with  . SEXUALLY TRANSMITTED DISEASE    HPI  Patient reports that he has recently had sex without condoms and would like a screening.  Denies symptoms and declines blood work today.  See flowsheet for further details and programmatic requirements.    The following portions of the patient's history were reviewed and updated as appropriate: allergies, current medications, past medical history, past social history, past surgical history and problem list.  Objective:  There were no vitals filed for this visit.  Physical Exam Constitutional:      General: He is not in acute distress.    Appearance: Normal appearance.  HENT:     Head: Normocephalic and atraumatic.     Mouth/Throat:     Mouth: Mucous membranes are moist.     Pharynx: Oropharynx is clear. No oropharyngeal exudate or posterior oropharyngeal erythema.  Eyes:     Conjunctiva/sclera: Conjunctivae normal.  Neck:     Musculoskeletal: Neck supple.  Pulmonary:     Effort: Pulmonary effort is normal.  Abdominal:     Palpations: Abdomen is soft. There is no mass.     Tenderness: There is no abdominal tenderness. There is no guarding or rebound.  Genitourinary:    Penis: Normal.      Scrotum/Testes: Normal.     Comments: Pubic area without nits, lice, edema, erythema, lesions and inguinal adenopathy. Penis circumcised and without discharge at meatus. Lymphadenopathy:     Cervical: No cervical adenopathy.  Skin:    General: Skin is warm and dry.     Findings: No bruising, erythema, lesion or rash.  Neurological:     Mental Status: He is alert and oriented to person, place, and time.  Psychiatric:       Mood and Affect: Mood normal.        Behavior: Behavior normal.        Thought Content: Thought content normal.        Judgment: Judgment normal.       Assessment and Plan:  Darrell Elliott is a 23 y.o. male presenting to the [facility] Department for STI screening  1. Screening for STD (sexually transmitted disease) Patient is without symptoms today.  Declines blood work today. Rec condoms with all sex. Await test results.  Counseled that RN will call if needs to RTC for treatment once results are back. - Gram stain - Gonococcus culture  2. NGU (nongonococcal urethritis) Treat for NGU with Azithromycin 1 g po DOT today. No sex for 7 days and until after partner completes treatment. RTC if vomits < 2 hr after taking medicine for re-treatment. - azithromycin (ZITHROMAX) tablet 1,000 mg     No follow-ups on file.  No future appointments.  Jerene Dilling, PA

## 2019-02-14 LAB — GONOCOCCUS CULTURE

## 2022-10-30 ENCOUNTER — Ambulatory Visit: Payer: Self-pay | Admitting: Family Medicine

## 2023-05-10 ENCOUNTER — Ambulatory Visit: Payer: Self-pay | Admitting: Nurse Practitioner

## 2023-05-10 DIAGNOSIS — Z113 Encounter for screening for infections with a predominantly sexual mode of transmission: Secondary | ICD-10-CM

## 2023-05-10 LAB — HM HIV SCREENING LAB: HM HIV Screening: NEGATIVE

## 2023-05-10 NOTE — Progress Notes (Signed)
Pt is here for STD screening.  Condoms declined.  Lafayette Dunlevy M Nesha Counihan, RN   

## 2023-05-10 NOTE — Progress Notes (Signed)
[facility] Department STI clinic [address], [address] [place] [address] Main phone: [phone]  STI screening visit  Subjective:  Darrell Elliott is a 28 y.o. male being seen today for an STI screening visit. The patient reports they do not have symptoms.    Patient has the following medical conditions:  There are no active problems to display for this patient.   Chief Complaint  Patient presents with   SEXUALLY TRANSMITTED DISEASE    Screening    HPI Patient is a 28 y.o. male who presents for asymptomatic STI screening. He indicates one male partner, practices oral and penile/vaginal penetrative sex, and  reports last sex around Christmas. He indicates never using condoms and has no STI history.    Last HIV test per patient/review of record was No results found for: "HMHIVSCREEN" No results found for: "HIV"  Last HEPC test per patient/review of record was No results found for: "HMHEPCSCREEN" No components found for: "HEPC"   Last HEPB test per patient/review of record was No components found for: "HMHEPBSCREEN"   Does the patient or their partner desires a pregnancy in the next year? No  Screening for MPX risk: Does the patient have an unexplained rash? No Is the patient MSM? No Does the patient endorse multiple sex partners or anonymous sex partners? No Did the patient have close or sexual contact with a person diagnosed with MPX? No Has the patient traveled outside the [place] where MPX is endemic? No Is there a high clinical suspicion for MPX-- evidenced by one of the following No  -Unlikely to be chickenpox  -Lymphadenopathy  -Rash that present in same phase of evolution on any given body part   See flowsheet for further details and programmatic requirements.    There is no immunization history on file for this patient.   The following portions of the patient's history were reviewed and updated as appropriate: allergies, current  medications, past medical history, past social history, past surgical history and problem list.  Objective:  There were no vitals filed for this visit. Patient declined basic PE. What is documented below is what could be inspected during conversation with patient. Patient did allow a check of lymph nodes in face, neck, and claviculare areas.  Physical Exam Nursing note reviewed.  Constitutional:      Appearance: Normal appearance. He is normal weight.  HENT:     Head:     Salivary Glands: Right salivary gland is not diffusely enlarged. Left salivary gland is not diffusely enlarged.     Mouth/Throat:     Lips: Pink. No lesions.     Comments: Patient would not approve me to look in his mouth.  Eyes:     General:        Right eye: No discharge.        Left eye: No discharge.  Pulmonary:     Effort: Pulmonary effort is normal.  Genitourinary:    Comments: Patient declined genital exam. Asymptomatic.  Lymphadenopathy:     Head:     Right side of head: No submental, submandibular, tonsillar, preauricular or posterior auricular adenopathy.     Left side of head: No submental, submandibular, tonsillar, preauricular or posterior auricular adenopathy.     Cervical:     Right cervical: No superficial or posterior cervical adenopathy.    Left cervical: No superficial or posterior cervical adenopathy.     Upper Body:     Right upper body: No  supraclavicular adenopathy.     Left upper body: No supraclavicular adenopathy.  Skin:    Comments: Patient would not allow a skin exam.   Neurological:     Mental Status: He is alert and oriented to person, place, and time.  Psychiatric:        Attention and Perception: Attention normal.        Mood and Affect: Mood normal.        Speech: Speech normal.        Behavior: Behavior normal. Behavior is cooperative.        Thought Content: Thought content normal.     Assessment and Plan:  Darrell Elliott is a 28 y.o. male presenting to the  [facility] Department for STI screening  1. Screening for venereal disease (Primary)  - Syphilis Serology, [facility] [place] - HIV [facility] - Chlamydia/GC NAA, Confirmation   Patient does not have STI symptoms Patient accepted all screenings including  urine GC/Chlamydia, and blood work for HIV/Syphilis. Patient meets criteria for HepB screening? No. Ordered? not applicable Patient meets criteria for HepC screening? No. Ordered? not applicable Recommended condom use with all sex Discussed importance of condom use for STI prevention  Treat positive test results per standing order. Discussed time line for [facility] results and that patient will be called with positive results and encouraged patient to call if he had not heard in 2 weeks Recommended repeat testing in 3 months with positive results. Recommended returning for continued or worsening symptoms.   Return if symptoms worsen or fail to improve, for STI screening.  No future appointments.  Total time with patient 15 minutes.   Edmonia James, NP

## 2023-05-15 LAB — CHLAMYDIA/GC NAA, CONFIRMATION
Chlamydia trachomatis, NAA: NEGATIVE
Neisseria gonorrhoeae, NAA: NEGATIVE
